# Patient Record
Sex: Male | Born: 1975 | Race: White | Hispanic: No | Marital: Married | Smoking: Never smoker
Health system: Southern US, Community
[De-identification: ages and names within clinical notes are randomized; demographics above are authoritative.]

---

## 2014-12-24 ENCOUNTER — Emergency Department
Admission: EM | Admit: 2014-12-24 | Discharge: 2014-12-24 | Disposition: A | Discharge status: Home / Self Care | Payer: BLUE CROSS/BLUE SHIELD | Source: Home / Self Care

## 2014-12-24 DIAGNOSIS — R197 Diarrhea, unspecified: Secondary | ICD-10-CM | POA: Diagnosis not present

## 2014-12-24 DIAGNOSIS — R109 Unspecified abdominal pain: Secondary | ICD-10-CM | POA: Diagnosis not present

## 2014-12-24 MED ORDER — SACCHAROMYCES BOULARDII 250 MG PO CAPS: 250.0000 mg | ORAL_CAPSULE | Freq: Two times a day (BID) | ORAL | Status: DC

## 2014-12-24 MED ORDER — CIPROFLOXACIN HCL 500 MG PO TABS: 500.0000 mg | ORAL_TABLET | Freq: Two times a day (BID) | ORAL | Status: DC

## 2014-12-24 MED ORDER — BISMUTH SUBSALICYLATE 262 MG PO CHEW: 524.0000 mg | CHEWABLE_TABLET | ORAL | Status: AC | PRN

## 2014-12-24 NOTE — ED Notes (Signed)
Stool specimen collection kit sent home with patient, will return with specimen

## 2014-12-24 NOTE — ED Provider Notes (Signed)
CSN: [id number]     Arrival date & time 12/24/14  1027 History   None    Chief Complaint  Patient presents with  . Diarrhea    x 9 days   (Consider location/radiation/quality/duration/timing/severity/associated sxs/prior Treatment) HPI  The pt is a 39yo male presenting to [facility] with c/o watery diarrhea with 4-5 episodes daily for the last 9 days.  Pt also c/o generalized abdominal cramping and discomfort, 2/10 at worst. Pt does note eating hamberger patties the night before symptoms started but states he did not think they were "bad" or out of date.  He has been taking imodium 3 times daily w/o relief.  Pt states symptoms has seemed to worsen.  Denies blood or mucous in stool. Denies urinary symptoms. Denies recent antibiotic use. Denies fever, chills, nausea or vomiting. Denies sick contacts or recent travel. No hx of abdominal surgeries.  History reviewed. No pertinent past medical history. History reviewed. No pertinent past surgical history. Family History  Problem Relation Age of Onset  . Heart disease Father    Social History  Substance Use Topics  . Smoking status: Never Smoker   . Smokeless tobacco: Never Used  . Alcohol Use: Yes     Comment: 2/week    Review of Systems  Constitutional: Negative for fever and chills.  Respiratory: Negative for cough and shortness of breath.   Gastrointestinal: Positive for abdominal pain and diarrhea. Negative for nausea, vomiting, constipation, blood in stool and anal bleeding.  Genitourinary: Negative for dysuria, frequency, hematuria, flank pain and decreased urine volume.  Musculoskeletal: Negative for myalgias and arthralgias.    Allergies  Review of patient's allergies indicates no known allergies.  Home Medications   Prior to Admission medications   Medication Sig Start Date End Date Taking? Authorizing Provider  escitalopram (LEXAPRO) 5 MG tablet Take 5 mg by mouth daily.   Yes Historical Provider, MD  bismuth subsalicylate  (PEPTO BISMOL) 262 MG chewable tablet Chew 2 tablets (524 mg total) by mouth as needed for diarrhea or loose stools. Every 0.5 to 1 hr, do not exceed 8 doses per day 12/24/14   Junius Finner, PA-C  ciprofloxacin (CIPRO) 500 MG tablet Take 1 tablet (500 mg total) by mouth 2 (two) times daily. One po bid x 7 days 12/24/14   Junius Finner, PA-C  saccharomyces boulardii (FLORASTOR) 250 MG capsule Take 1 capsule (250 mg total) by mouth 2 (two) times daily. 12/24/14   Junius Finner, PA-C   BP 119/72 mmHg  Pulse 95  Temp(Src) 98.3 F (36.8 C) (Oral)  Ht  (1.854 m)  Wt 196 lb 8 oz (89.132 kg)  BMI 25.93 kg/m2  SpO2 97% Physical Exam  Constitutional: He appears well-developed and well-nourished.  HENT:  Head: Normocephalic and atraumatic.  Mouth/Throat: Oropharynx is clear and moist.  Eyes: Conjunctivae are normal. No scleral icterus.  Neck: Normal range of motion. Neck supple.  Cardiovascular: Normal rate, regular rhythm and normal heart sounds.   Pulmonary/Chest: Effort normal and breath sounds normal. No respiratory distress. He has no wheezes. He has no rales. He exhibits no tenderness.  Abdominal: Soft. Bowel sounds are normal. He exhibits no distension and no mass. There is no tenderness. There is no rebound and no guarding.  Soft, non-distended, non-tender. No CVAT  Musculoskeletal: Normal range of motion.  Neurological: He is alert.  Skin: Skin is warm and dry.  Nursing note and vitals reviewed.   ED Course  Procedures (including critical care time) Labs Review Labs  Reviewed  STOOL CULTURE    Imaging Review No results found.   MDM   1. Diarrhea   2. Abdominal cramping     Pt c/o watery diarrhea for 9 days. No sick contacts, recent travel, or recent use of antibiotics. Pt is afebrile, moist mucous membranes. Abd: soft, non-tender. Will order stool sample and have pt bring back to clinic at his convenience later today or tomorrow. Will tx empirically for bacterial  diarrhea. Rx: Cipro 500mg  BID x 7 days. Advised to d/c imodium and try pepto bismol as well as probiotic Florastor BID for up to 7 days. Advised to f/u with PCP in 3-4 days if not improving. Discussed symptoms that warrant emergent care in the ED. Patient verbalized understanding and agreement with treatment plan.   Junius Finner, PA-C 12/24/14 1126

## 2014-12-24 NOTE — ED Notes (Signed)
Has had upset stomach with diarrhea for 9 days, stomach discomfort

## 2014-12-25 ENCOUNTER — Telehealth: Payer: Self-pay | Admitting: *Deleted

## 2014-12-28 LAB — STOOL CULTURE

## 2016-07-15 DIAGNOSIS — H5213 Myopia, bilateral: Secondary | ICD-10-CM | POA: Diagnosis not present

## 2016-07-15 DIAGNOSIS — H5702 Anisocoria: Secondary | ICD-10-CM | POA: Diagnosis not present

## 2016-07-15 DIAGNOSIS — H52223 Regular astigmatism, bilateral: Secondary | ICD-10-CM | POA: Diagnosis not present

## 2017-02-04 DIAGNOSIS — Z125 Encounter for screening for malignant neoplasm of prostate: Secondary | ICD-10-CM | POA: Diagnosis not present

## 2017-02-04 DIAGNOSIS — Z0000 Encounter for general adult medical examination without abnormal findings: Secondary | ICD-10-CM | POA: Diagnosis not present

## 2017-03-23 DIAGNOSIS — Z23 Encounter for immunization: Secondary | ICD-10-CM | POA: Diagnosis not present

## 2017-03-23 DIAGNOSIS — Z0001 Encounter for general adult medical examination with abnormal findings: Secondary | ICD-10-CM | POA: Diagnosis not present

## 2017-03-23 DIAGNOSIS — R740 Nonspecific elevation of levels of transaminase and lactic acid dehydrogenase [LDH]: Secondary | ICD-10-CM | POA: Diagnosis not present

## 2017-06-17 DIAGNOSIS — R740 Nonspecific elevation of levels of transaminase and lactic acid dehydrogenase [LDH]: Secondary | ICD-10-CM | POA: Diagnosis not present

## 2017-06-24 DIAGNOSIS — R740 Nonspecific elevation of levels of transaminase and lactic acid dehydrogenase [LDH]: Secondary | ICD-10-CM | POA: Diagnosis not present

## 2018-03-20 DIAGNOSIS — Z0000 Encounter for general adult medical examination without abnormal findings: Secondary | ICD-10-CM | POA: Diagnosis not present

## 2018-03-20 DIAGNOSIS — Z125 Encounter for screening for malignant neoplasm of prostate: Secondary | ICD-10-CM | POA: Diagnosis not present

## 2018-03-27 DIAGNOSIS — Z0000 Encounter for general adult medical examination without abnormal findings: Secondary | ICD-10-CM | POA: Diagnosis not present

## 2018-03-27 DIAGNOSIS — E663 Overweight: Secondary | ICD-10-CM | POA: Diagnosis not present

## 2018-03-27 DIAGNOSIS — R740 Nonspecific elevation of levels of transaminase and lactic acid dehydrogenase [LDH]: Secondary | ICD-10-CM | POA: Diagnosis not present

## 2018-10-30 ENCOUNTER — Ambulatory Visit (INDEPENDENT_AMBULATORY_CARE_PROVIDER_SITE_OTHER): Payer: Commercial Managed Care - PPO | Admitting: Cardiology

## 2018-10-30 ENCOUNTER — Encounter: Payer: Self-pay | Admitting: Cardiology

## 2018-10-30 ENCOUNTER — Other Ambulatory Visit: Payer: Self-pay

## 2018-10-30 VITALS — BP 131/89 | HR 72 | Ht 73.0 in | Wt 215.0 lb

## 2018-10-30 DIAGNOSIS — Z8249 Family history of ischemic heart disease and other diseases of the circulatory system: Secondary | ICD-10-CM | POA: Diagnosis not present

## 2018-10-30 DIAGNOSIS — R0789 Other chest pain: Secondary | ICD-10-CM | POA: Diagnosis not present

## 2018-10-30 DIAGNOSIS — F419 Anxiety disorder, unspecified: Secondary | ICD-10-CM

## 2018-10-30 NOTE — Progress Notes (Signed)
Primary Physician:  Deland Pretty, MD   Patient ID: Chris Matthews, male    DOB: 1976/05/03, 43 y.o.   MRN: [id number]  Subjective:    Chief Complaint  Patient presents with  . Chest Pain  . Fatigue    HPI: Chris Matthews  is a 43 y.o. male  with no significant past medical history referred to [place] by Dr. Shelia Media for chest pain.  Patient reports that in mid-March he began having occasional chest pain lasting for 5 minutes several times a day. He reports pain was fairly mild. He has not found any exacerbating or alleviating factors. Chest pain not associated with shortness of breath and has not noticed this with activity. Denies any palpitations, leg edema, claudication, or symptoms suggestive of TIA. Patient does feel related to anxiety. Does notice occasional discomfort in times of stress. Reports being under a lot of mental stress. He was on Lexapro before, and did have the dosage increased and has noticed some slight improvement with this.  He does not exercise, but fairly active with maintaining 4 acres at home. He does mention he notices some decreased activity tolerance.   Patient is self-employed. Does report that his father had MI in early 74's, no other family history of heart disease.  History reviewed. No pertinent past medical history.  History reviewed. No pertinent surgical history.  Social History   Socioeconomic History  . Marital status: Married    Spouse name: Not on file  . Number of children: 2  . Years of education: Not on file  . Highest education level: Not on file  Occupational History  . Not on file  Social Needs  . Financial resource strain: Not on file  . Food insecurity    Worry: Not on file    Inability: Not on file  . Transportation needs    Medical: Not on file    Non-medical: Not on file  Tobacco Use  . Smoking status: Never Smoker  . Smokeless tobacco: Never Used  Substance and Sexual Activity  . Alcohol use: Yes    Comment: rarely  . Drug  use: No  . Sexual activity: Not on file  Lifestyle  . Physical activity    Days per week: Not on file    Minutes per session: Not on file  . Stress: Not on file  Relationships  . Social Herbalist on phone: Not on file    Gets together: Not on file    Attends religious service: Not on file    Active member of club or organization: Not on file    Attends meetings of clubs or organizations: Not on file    Relationship status: Not on file  . Intimate partner violence    Fear of current or ex partner: Not on file    Emotionally abused: Not on file    Physically abused: Not on file    Forced sexual activity: Not on file  Other Topics Concern  . Not on file  Social History Narrative  . Not on file    Review of Systems  Constitution: Negative for decreased appetite, malaise/fatigue, weight gain and weight loss.  Eyes: Negative for visual disturbance.  Cardiovascular: Positive for chest pain. Negative for claudication, dyspnea on exertion, leg swelling, orthopnea, palpitations and syncope.  Respiratory: Negative for hemoptysis and wheezing.   Endocrine: Negative for cold intolerance and heat intolerance.  Hematologic/Lymphatic: Does not bruise/bleed easily.  Skin: Negative for nail changes.  Musculoskeletal:  Negative for muscle weakness and myalgias.  Gastrointestinal: Negative for abdominal pain, change in bowel habit, nausea and vomiting.  Neurological: Negative for difficulty with concentration, dizziness, focal weakness and headaches.  Psychiatric/Behavioral: Negative for altered mental status and suicidal ideas.  All other systems reviewed and are negative.     Objective:  Blood pressure 131/89, pulse 72, height 6' 1"  (1.854 m), weight 215 lb (97.5 kg), SpO2 97 %. Body mass index is 28.37 kg/m.    Physical Exam  Constitutional: He is oriented to person, place, and time. Vital signs are normal. He appears well-developed and well-nourished.  HENT:  Head:  Normocephalic and atraumatic.  Neck: Normal range of motion.  Cardiovascular: Normal rate, regular rhythm, normal heart sounds and intact distal pulses.  Pulmonary/Chest: Effort normal and breath sounds normal. No accessory muscle usage. No respiratory distress.  Abdominal: Soft. Bowel sounds are normal.  Musculoskeletal: Normal range of motion.  Neurological: He is alert and oriented to person, place, and time.  Skin: Skin is warm and dry.  Vitals reviewed.  Radiology: No results found.  Laboratory examination:   03/20/2018: CBC normal.  Glucose 100, ALT 60, AST 26, creatinine 1.01, EGFR 92, potassium 4.5, CMP otherwise normal.  Cholesterol 187, triglycerides 205, HDL 41, LDL 105.  TSH normal.  No flowsheet data found. No flowsheet data found. Lipid Panel  No results found for: CHOL, TRIG, HDL, CHOLHDL, VLDL, LDLCALC, LDLDIRECT HEMOGLOBIN A1C No results found for: HGBA1C, MPG TSH No results for input(s): TSH in the last 8760 hours.  PRN Meds:. Medications Discontinued During This Encounter  Medication Reason  . ciprofloxacin (CIPRO) 500 MG tablet Error  . saccharomyces boulardii (FLORASTOR) 250 MG capsule Error   Current Meds  Medication Sig  . bismuth subsalicylate (PEPTO BISMOL) 262 MG chewable tablet Chew 2 tablets (524 mg total) by mouth as needed for diarrhea or loose stools. Every 0.5 to 1 hr, do not exceed 8 doses per day  . escitalopram (LEXAPRO) 10 MG tablet Take 10 mg by mouth daily. 15 mg daily    Cardiac Studies:     Assessment:     ICD-10-CM   1. Atypical chest pain  R07.89 EKG 12-Lead    CT CORONARY MORPH W/CTA COR W/SCORE W/CA W/CM &/OR WO/CM  2. Family history of heart disease  Z82.49 CT CORONARY MORPH W/CTA COR W/SCORE W/CA W/CM &/OR WO/CM  3. Anxiety  F41.9     EKG 10/30/2018: Sinus bradycardia at 59 bpm, normal axis, no evidence of ischemia. Normal EKG.    Recommendations:   Patient without any significant past medical history, referred to  [place] for evaluation of chest pain.  Patient has atypical chest pain symptoms that seems to be exacerbated by minimal stress.  He does have family history of CAD in his father.  I have recommended further evaluation with coronary CTA with calcium score given his family history of CAD and for resuscitation.  Unless calcium score is 0, he would likely benefit from statin therapy.  We will further discuss this upon results.  Do not feel that he needs further cardiac testing at this time.  Physical exam and EKG are unremarkable.  He does have anxiety and has noticed some improvement with increased dose of Lexapro.  I have also discussed working to control his stress with regular exercise as well as making some diet changes to ensure that his cholesterol and weight remain stable.  I will notify him of test results once available, and depending upon this  will make further recommendations at that time.  We will see him back on a as needed basis unless coronary CTA is abnormal.   *I have discussed this case with Dr. Virgina Jock and he personally examined the patient and participated in formulating the plan.*   Miquel Dunn, MSN, APRN, FNP-C [facility]. [place] Office: [phone] Fax: [phone]

## 2018-11-21 ENCOUNTER — Telehealth (HOSPITAL_COMMUNITY): Payer: Self-pay | Admitting: Emergency Medicine

## 2018-11-21 ENCOUNTER — Other Ambulatory Visit (HOSPITAL_COMMUNITY): Payer: Self-pay

## 2018-11-21 NOTE — Telephone Encounter (Signed)
Left message on voicemail with name and callback number Zimir Kittleson RN Navigator Cardiac Imaging [facility] [phone] Office [phone] Cell  

## 2018-11-23 ENCOUNTER — Ambulatory Visit (HOSPITAL_COMMUNITY)
Admission: RE | Admit: 2018-11-23 | Discharge: 2018-11-23 | Disposition: A | Discharge status: Home / Self Care | Payer: Commercial Managed Care - PPO | Source: Ambulatory Visit | Attending: Cardiology | Admitting: Cardiology

## 2018-11-23 ENCOUNTER — Encounter: Payer: Commercial Managed Care - PPO | Admitting: *Deleted

## 2018-11-23 ENCOUNTER — Other Ambulatory Visit: Payer: Self-pay

## 2018-11-23 DIAGNOSIS — R0789 Other chest pain: Secondary | ICD-10-CM

## 2018-11-23 DIAGNOSIS — Z8249 Family history of ischemic heart disease and other diseases of the circulatory system: Secondary | ICD-10-CM | POA: Insufficient documentation

## 2018-11-23 DIAGNOSIS — Z006 Encounter for examination for normal comparison and control in clinical research program: Secondary | ICD-10-CM

## 2018-11-23 MED ORDER — NITROGLYCERIN 0.4 MG SL SUBL
0.8000 mg | SUBLINGUAL_TABLET | Freq: Once | SUBLINGUAL | Status: AC
  Administered 2018-11-23: 0.8 mg via SUBLINGUAL
  Filled 2018-11-23: qty 25

## 2018-11-23 MED ORDER — METOPROLOL TARTRATE 5 MG/5ML IV SOLN
5.0000 mg | INTRAVENOUS | Status: DC | PRN
  Filled 2018-11-23: qty 5

## 2018-11-23 MED ORDER — NITROGLYCERIN 0.4 MG SL SUBL
SUBLINGUAL_TABLET | SUBLINGUAL | Status: AC
  Filled 2018-11-23: qty 2

## 2018-11-23 MED ORDER — IOHEXOL 350 MG/ML SOLN
80.0000 mL | Freq: Once | INTRAVENOUS | Status: AC | PRN
  Administered 2018-11-23: 80 mL via INTRAVENOUS

## 2018-11-23 NOTE — Research (Signed)
CADFEM Informed Consent   Subject Name: Chris Matthews  Subject met inclusion and exclusion criteria.  The informed consent form, study requirements and expectations were reviewed with the subject and questions and concerns were addressed prior to the signing of the consent form.  The subject verbalized understanding of the trial requirements.  The subject agreed to participate in the CADFEM trial and signed the informed consent at [place] on 23/jul/2020.  The informed consent was obtained prior to performance of any protocol-specific procedures for the subject.  A copy of the signed informed consent was given to the subject and a copy was placed in the subject's medical record.   Hedrick,Callista Hoh W

## 2018-11-24 NOTE — Progress Notes (Signed)
Pt aware.

## 2019-08-03 ENCOUNTER — Other Ambulatory Visit: Payer: Self-pay | Admitting: Internal Medicine

## 2019-08-03 DIAGNOSIS — N62 Hypertrophy of breast: Secondary | ICD-10-CM

## 2020-12-13 IMAGING — CT CT HEAR MORPH WITH CTA COR WITH SCORE WITH CA WITH CONTRAST AND
4 of 7 series · 8 of 20 positions shown, 9 images · non-contrast
Comparison: None.
COMPARISON: None.

Addendum:
EXAM:
OVER-READ INTERPRETATION  CT CHEST

The following report is an over-read performed by radiologist Dr.
Vajiheh Varaste [REDACTED] on 11/23/2018. This over-read
does not include interpretation of cardiac or coronary anatomy or
pathology. The coronary CTA interpretation by the cardiologist is
attached.
CLINICAL DATA: 43-year-old male with FH of CAD and atypical chest
pain.
Cardiac/Coronary  CTA
TECHNIQUE: The patient was scanned on a [employer] Force scanner.

[Series 6: best diast 78 % · axial · 0.33mm/px · z∈[+1130,+1174]mm · 2 of 331 slices shown, 3 images]
[im 111/331  vessel]
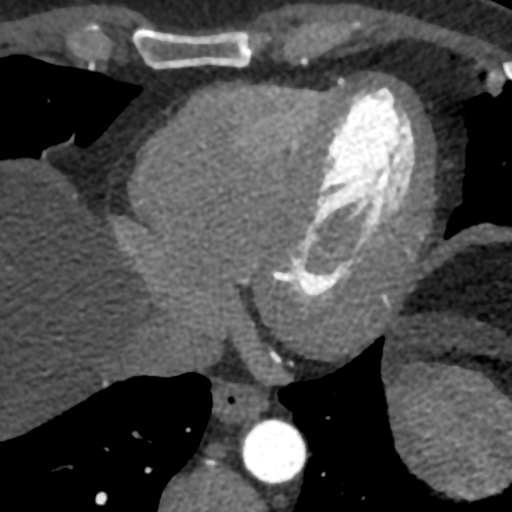
[im 111/331  lung]
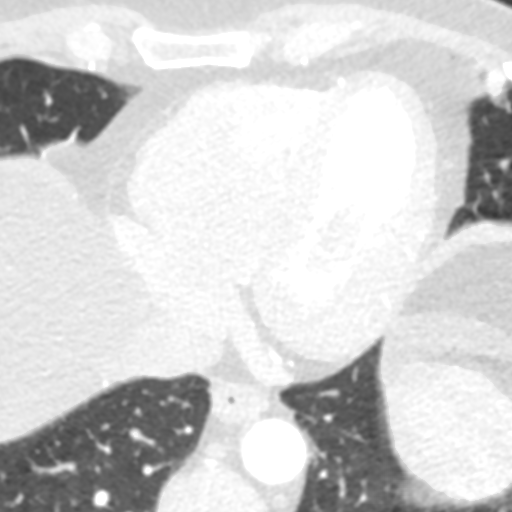
[im 221/331  vessel]
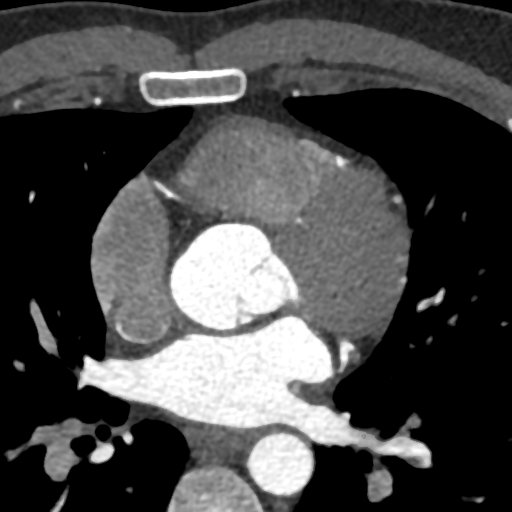

[Series 7: best syst 35 % · axial · 0.33mm/px · z∈[+1130,+1174]mm · 2 of 331 slices shown]
[im 111/331  vessel]
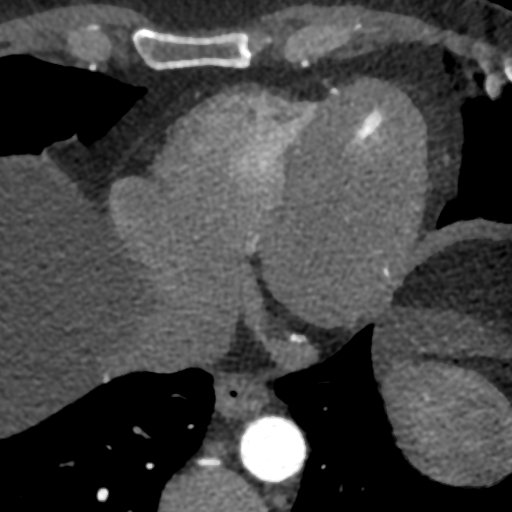
[im 221/331  vessel]
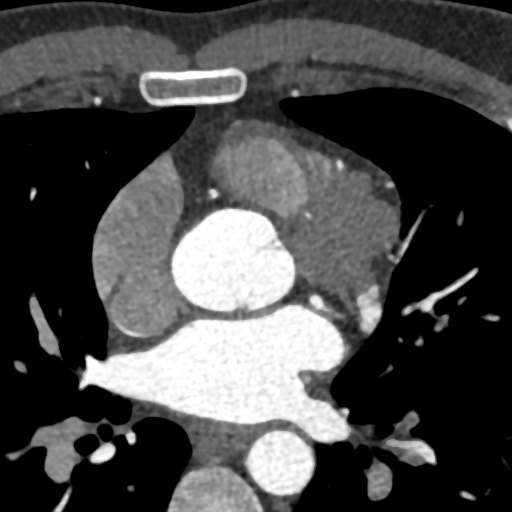

[Series 8: ts diast sharp 78 % · axial · 0.33mm/px · z∈[+1130,+1174]mm · 2 of 331 slices shown]
[im 111/331  lung]
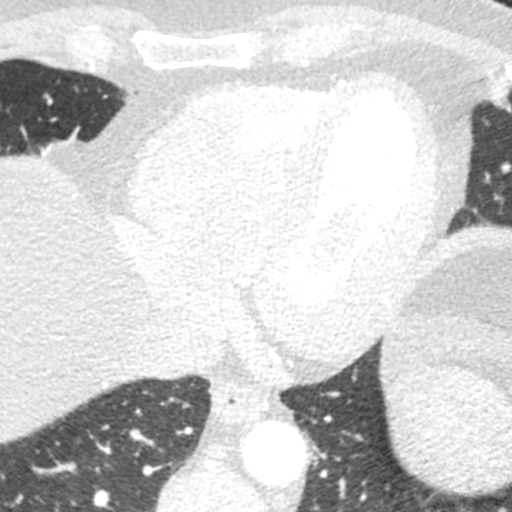
[im 221/331  lung]
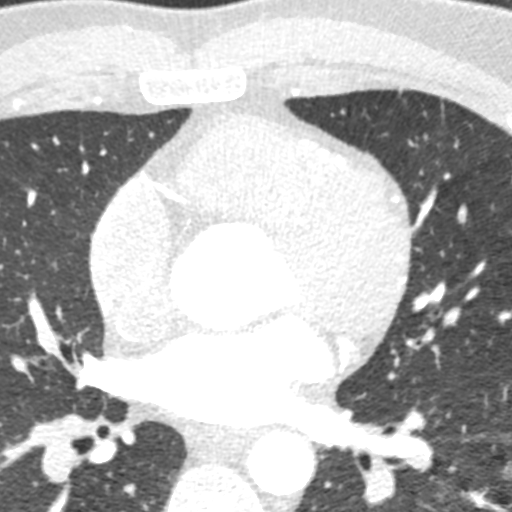

[Series 9: ts syst sharp 35 % · axial · 0.33mm/px · z∈[+1130,+1174]mm · 2 of 331 slices shown]
[im 111/331  lung]
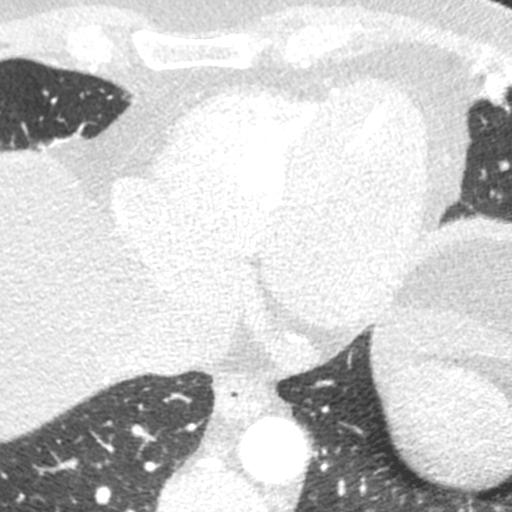
[im 221/331  lung]
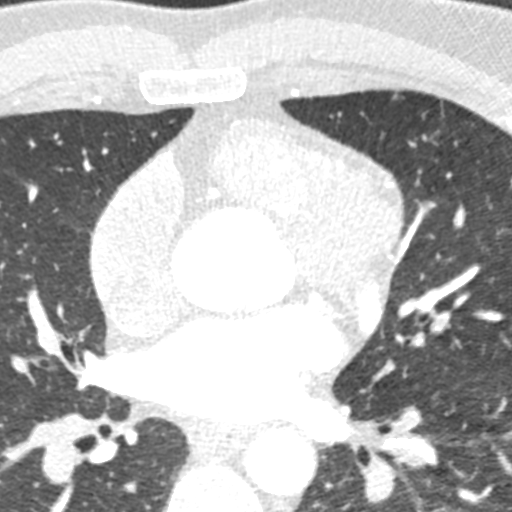

[8 of 20 positions shown; findings below may reference images not displayed]

FINDINGS: Vascular: Heart is normal size.  Visualized aorta is normal caliber.

Mediastinum/Nodes: No adenopathy in the lower mediastinum or hila.

Lungs/Pleura: Lungs clear.  No effusions.

Upper Abdomen: Fatty infiltration of the liver.  No acute findings.

Musculoskeletal: Chest wall soft tissues are unremarkable. No acute
bony abnormality.
IMPRESSION: Diffuse fatty infiltration of the liver.
FINDINGS: A 100 kV prospective scan was triggered in the descending thoracic
aorta at 111 HU's. Axial non-contrast 3 mm slices were carried out
through the heart. The data set was analyzed on a dedicated work
station and scored using the Agatson method. Gantry rotation speed
was 250 msecs and collimation was .6 mm. No beta blockade and 0.8 mg
of sl NTG was given. The 3D data set was reconstructed in 5%
intervals of the 67-82 % of the R-R cycle. Diastolic phases were
analyzed on a dedicated work station using MPR, MIP and VRT modes.
The patient received 80 cc of contrast.

Aorta:  Normal size.  No calcifications.  No dissection.

Aortic Valve:  Trileaflet.  No calcifications.

Coronary Arteries:  Normal coronary origin.  Left dominance.

Left main is a large artery that gives rise to LAD, ramus
intermedius and LCX arteries. Left main has no plaque.

LAD is a large vessel that gives rise to one diagonal artery and has
no plaque.

LCX is a large dominant artery that gives rise to one large OM1
branch, PDA and a small PLA. There is no plaque.

RCA is a very small non-dominant artery.

Other findings:

Normal pulmonary vein drainage into the left atrium.

Normal left atrial appendage without a thrombus.

Normal size of the pulmonary artery.
IMPRESSION: 1. Coronary calcium score of 0. This was 0 percentile for age and
sex matched control.

2. Normal coronary origin with left dominance.

3. No evidence of CAD. CAD-RADS 0. Consider non-atherosclerotic
causes of chest pain.

*** End of Addendum ***
EXAM:
OVER-READ INTERPRETATION  CT CHEST

The following report is an over-read performed by radiologist Dr.
Vajiheh Varaste [REDACTED] on 11/23/2018. This over-read
does not include interpretation of cardiac or coronary anatomy or
pathology. The coronary CTA interpretation by the cardiologist is
attached.
FINDINGS: Vascular: Heart is normal size.  Visualized aorta is normal caliber.

Mediastinum/Nodes: No adenopathy in the lower mediastinum or hila.

Lungs/Pleura: Lungs clear.  No effusions.

Upper Abdomen: Fatty infiltration of the liver.  No acute findings.

Musculoskeletal: Chest wall soft tissues are unremarkable. No acute
bony abnormality.
IMPRESSION: Diffuse fatty infiltration of the liver.
